# Patient Record
Sex: Female | Born: 1975 | Hispanic: Yes | Marital: Single | Smoking: Never smoker
Health system: Southern US, Community
[De-identification: ages and names within clinical notes are randomized; demographics above are authoritative.]

---

## 2019-07-30 ENCOUNTER — Emergency Department (HOSPITAL_BASED_OUTPATIENT_CLINIC_OR_DEPARTMENT_OTHER)
Admission: EM | Admit: 2019-07-30 | Discharge: 2019-07-30 | Disposition: A | Discharge status: Home / Self Care | Payer: 59 | Attending: Emergency Medicine | Admitting: Emergency Medicine

## 2019-07-30 ENCOUNTER — Other Ambulatory Visit: Payer: Self-pay

## 2019-07-30 ENCOUNTER — Encounter (HOSPITAL_BASED_OUTPATIENT_CLINIC_OR_DEPARTMENT_OTHER): Payer: Self-pay | Admitting: Emergency Medicine

## 2019-07-30 DIAGNOSIS — U071 COVID-19: Secondary | ICD-10-CM | POA: Diagnosis not present

## 2019-07-30 DIAGNOSIS — R519 Headache, unspecified: Secondary | ICD-10-CM | POA: Diagnosis present

## 2019-07-30 LAB — SARS CORONAVIRUS 2 AG (30 MIN TAT): SARS Coronavirus 2 Ag: POSITIVE — AB

## 2019-07-30 MED ORDER — DIPHENHYDRAMINE HCL 50 MG/ML IJ SOLN: 12.5000 mg | Freq: Once | INTRAMUSCULAR | Status: DC

## 2019-07-30 MED ORDER — BENZONATATE 100 MG PO CAPS: 100.0000 mg | ORAL_CAPSULE | Freq: Three times a day (TID) | ORAL | 0 refills | Status: DC

## 2019-07-30 MED ORDER — KETOROLAC TROMETHAMINE 15 MG/ML IJ SOLN
15.0000 mg | Freq: Once | INTRAMUSCULAR | Status: AC
  Administered 2019-07-30: 15 mg via INTRAMUSCULAR
  Filled 2019-07-30: qty 1

## 2019-07-30 MED ORDER — ONDANSETRON 4 MG PO TBDP: 4.0000 mg | ORAL_TABLET | Freq: Three times a day (TID) | ORAL | 0 refills | Status: DC | PRN

## 2019-07-30 MED ORDER — KETOROLAC TROMETHAMINE 15 MG/ML IJ SOLN: 15.0000 mg | Freq: Once | INTRAMUSCULAR | Status: DC

## 2019-07-30 MED ORDER — DIPHENHYDRAMINE HCL 50 MG/ML IJ SOLN
12.5000 mg | Freq: Once | INTRAMUSCULAR | Status: AC
  Administered 2019-07-30: 12.5 mg via INTRAMUSCULAR
  Filled 2019-07-30: qty 1

## 2019-07-30 MED ORDER — PROCHLORPERAZINE EDISYLATE 10 MG/2ML IJ SOLN: 10.0000 mg | Freq: Once | INTRAMUSCULAR | Status: DC

## 2019-07-30 MED ORDER — PROCHLORPERAZINE EDISYLATE 10 MG/2ML IJ SOLN
10.0000 mg | Freq: Once | INTRAMUSCULAR | Status: AC
  Administered 2019-07-30: 10 mg via INTRAMUSCULAR
  Filled 2019-07-30: qty 2

## 2019-07-30 NOTE — ED Triage Notes (Signed)
Pt c/o HA x 4 days, loss of taste and smell x 2 days. Pt denies fever at this time. Pt also reports legs "feel weak". Norma gait, NAD. Pt states she is here for covid test for symptoms, denies any known exposure

## 2019-07-30 NOTE — ED Provider Notes (Signed)
[facility] EMERGENCY DEPARTMENT Provider Note   CSN: [id number] Arrival date & time: 07/30/19  1256     History Chief Complaint  Patient presents with  . Headache    Jacqueline Stone is a 43 y.o. female with no significant past medical history who presents to the ED due to intermittent and worsening bilateral frontal headache for the past week.  Patient notes she typically has similar headaches right before her menstrual period, but states this headache has become more consistent.  She describes her headache as a tightness feeling and rates it a 5/10.  Her last menstrual period was November 13.  Patient notes her headache went away for a few days but has become more constant over the past 4 days.  Patient also notes loss of taste and smell for the past 2 days. Patient states 7 coworkers have tested positive for Covid at her work.  Patient admits to dry cough, congestion, and intermittent nausea.  Patient has not tried anything at home for her symptoms.  Patient denies shortness of breath, fever, chest pain, vomiting, and diarrhea.   History reviewed. No pertinent past medical history.  There are no problems to display for this patient.   History reviewed. No pertinent surgical history.   OB History   No obstetric history on file.     History reviewed. No pertinent family history.  Social History   Tobacco Use  . Smoking status: Never Smoker  Substance Use Topics  . Alcohol use: Never  . Drug use: Never    Home Medications Prior to Admission medications   Medication Sig Start Date End Date Taking? Authorizing Provider  benzonatate (TESSALON) 100 MG capsule Take 1 capsule (100 mg total) by mouth every 8 (eight) hours. 07/30/19   Cheek, Chrys Racer B, PA-C  ondansetron (ZOFRAN ODT) 4 MG disintegrating tablet Take 1 tablet (4 mg total) by mouth every 8 (eight) hours as needed for nausea or vomiting. 07/30/19   Cheek, Comer Locket, PA-C    Allergies    Patient has no  allergy information on record.  Review of Systems   Review of Systems  Constitutional: Positive for fatigue. Negative for chills and fever.  HENT: Positive for congestion. Negative for sinus pressure, sinus pain, sore throat, tinnitus and trouble swallowing.   Respiratory: Positive for cough. Negative for shortness of breath.   Cardiovascular: Negative for chest pain and leg swelling.  Gastrointestinal: Positive for abdominal pain (generalized) and nausea. Negative for diarrhea and vomiting.  Genitourinary: Negative for dysuria and vaginal discharge.  Neurological: Positive for weakness (generalized) and headaches. Negative for dizziness, syncope and numbness.  All other systems reviewed and are negative.   Physical Exam Updated Vital Signs BP 116/73 (BP Location: Right Arm)   Pulse 74   Temp 98.7 F (37.1 C) (Oral)   Resp 18   Ht 5' (1.524 m)   Wt 72.6 kg   LMP 06/30/2019 (Approximate)   SpO2 100%   BMI 31.25 kg/m   Physical Exam Vitals and nursing note reviewed.  Constitutional:      General: She is not in acute distress.    Appearance: She is not ill-appearing.  HENT:     Head: Normocephalic.     Mouth/Throat:     Comments: Posterior oropharynx clear and mucous membranes moist, there is mild erythema but no edema or tonsillar exudates, uvula midline, normal phonation, no trismus, tolerating secretions without difficulty. Eyes:     Conjunctiva/sclera: Conjunctivae normal.     Pupils:  Pupils are equal, round, and reactive to light.  Neck:     Comments: No meningismus  Cardiovascular:     Rate and Rhythm: Normal rate and regular rhythm.     Pulses: Normal pulses.     Heart sounds: Normal heart sounds. No murmur. No friction rub. No gallop.   Pulmonary:     Effort: Pulmonary effort is normal.     Breath sounds: Normal breath sounds.     Comments: Respirations equal and unlabored, patient able to speak in full sentences, lungs clear to auscultation bilaterally  Abdominal:     General: Abdomen is flat. Bowel sounds are normal. There is no distension.     Palpations: Abdomen is soft.     Tenderness: There is no abdominal tenderness. There is no guarding or rebound.  Musculoskeletal:     Cervical back: Normal range of motion and neck supple. No rigidity or tenderness.     Comments: Able to move all 4 extremities without difficulty. No lower extremity edema. Distal sensation and pulses intact bilaterally.  Skin:    General: Skin is warm and dry.  Neurological:     General: No focal deficit present.     Mental Status: She is alert.     Comments: Speech is clear, able to follow commands CN III-XII intact Normal strength in upper and lower extremities bilaterally including dorsiflexion and plantar flexion, strong and equal grip strength Sensation grossly intact throughout Moves extremities without ataxia, coordination intact No pronator drift Ambulates without difficulty      ED Results / Procedures / Treatments   Labs (all labs ordered are listed, but only abnormal results are displayed) Labs Reviewed  SARS CORONAVIRUS 2 AG (30 MIN TAT) - Abnormal; Notable for the following components:      Result Value   SARS Coronavirus 2 Ag POSITIVE (*)    All other components within normal limits    EKG None  Radiology No results found.  Procedures Procedures (including critical care time)  Medications Ordered in ED Medications  diphenhydrAMINE (BENADRYL) injection 12.5 mg (12.5 mg Intramuscular Given 07/30/19 1439)  ketorolac (TORADOL) 15 MG/ML injection 15 mg (15 mg Intramuscular Given 07/30/19 1439)  prochlorperazine (COMPAZINE) injection 10 mg (10 mg Intramuscular Given 07/30/19 1440)    ED Course  I have reviewed the triage vital signs and the nursing notes.  Pertinent labs & imaging results that were available during my care of the patient were reviewed by me and considered in my medical decision making (see chart for details).     MDM Rules/Calculators/A&P     CHA2DS2/VAS Stroke Risk Points      N/A >= 2 Points: High Risk  1 - 1.99 Points: Medium Risk  0 Points: Low Risk    A final score could not be computed because of missing components.: Last  Change: N/A     This score determines the patient's risk of having a stroke if the  patient has atrial fibrillation.      This score is not applicable to this patient. Components are not  calculated.                  43 year old female presents to the ED due to bilateral frontal headache and COVID-like symptoms. Patient had 7 coworkers test positive for COVID. Vitals all within normal limits. Patient in no acute distress and rather well appearing. Abdomen soft, non-distended, and non-tender. No peritoneal signs. Lungs clear to auscultation bilaterally. Normal neurological exam. Suspect  tension headache with possible relationship to COVID-19 infection. Doubt emergent conditions such as SAH given normal neurological exam and longevity of symptoms. No concern for meningitis at this time. Will test for COVID and treat pain with headache cocktail and reassess.  Reassessed patient at bedside after headache medication. Patient notes her headache has improved. Rapid COVID test is positive. Discussed with patient in length about quarantine guidelines. Will treat patient with cough medication and Zofran for nausea. Patient advised to return to the ER if she develops severe shortness of breath, chest pain, or worsening symptoms. Patient advised to continue to drink a lot of fluids. Patient able to ambulate in the ED and maintain O2 saturation >95% and didn't complain of shortness of breath or dizziness. Strict ED precautions discussed with patient. Patient states understanding and agrees to plan. Patient discharged home in no acute distress and stable vitals  Jacqueline Stone was evaluated in Emergency Department on 07/30/2019 for the symptoms described in the history of present illness. She was  evaluated in the context of the global COVID-19 pandemic, which necessitated consideration that the patient might be at risk for infection with the SARS-CoV-2 virus that causes COVID-19. Institutional protocols and algorithms that pertain to the evaluation of patients at risk for COVID-19 are in a state of rapid change based on information released by regulatory bodies including the CDC and federal and state organizations. These policies and algorithms were followed during the patient's care in the ED.  Final Clinical Impression(s) / ED Diagnoses Final diagnoses:  COVID-19 virus infection    Rx / DC Orders ED Discharge Orders         Ordered    benzonatate (TESSALON) 100 MG capsule  Every 8 hours     07/30/19 1527    ondansetron (ZOFRAN ODT) 4 MG disintegrating tablet  Every 8 hours PRN     07/30/19 1527           Lorelle Formosa 07/30/19 1608    Cathren Laine, MD 07/31/19 1358

## 2019-07-30 NOTE — Discharge Instructions (Addendum)
As discussed, you tested positive for COVID-19 which is a viral infection that does not require antibiotics. I am sending you home with cough medication and nausea medication. Take as prescribed and use as needed. You must quarantine at home 10 days after first symptom and must be fever free for 24 hours before returning to work. You may want to contact you work to see what their policy is. Continue drinking a lot of fluids. Return to the ER if you develop severe shortness of breath, central chest pain, or worsening of symptoms.

## 2021-08-20 NOTE — Progress Notes (Signed)
Subjective:    CC: L foot pain  I, Molly Weber, LAT, ATC, am serving as scribe for Dr. Clementeen Graham.  HPI: Pt is a 46 y/o female presenting w/ L foot pain x 1 year and 4 month w/ no MOI.  She locates her pain to medial longitudinal arch. 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  Pain is located primarily at the medial to plantar aspect of the navicular area.  She notes that typically the stilted boots are worse than regular shoes.  She is tried some custom-made orthotics which did not help very much.  L foot swelling: yes- w/ increased activity, medial ankle/foot Aggravating factors: wearing the steel-toed shoes on for work Treatments tried: prior dr visits, ice, shoe inserts, compression  Pertinent review of Systems: No fevers or chills  Relevant historical information: Otherwise healthy   Objective:    Vitals:   08/21/21 0955  BP: 102/70  Pulse: 82  SpO2: 98%   General: Well Developed, well nourished, and in no acute distress.   MSK: Left foot mild pes planus otherwise normal-appearing Mildly tender palpation medial and plantar midfoot near the navicular area. Normal foot and ankle motion. Intact strength. Pulses capillary refill and sensation are intact distally.  Lab and Radiology Results  Procedure: Real-time Ultrasound Guided Injection of left foot talocalcaneonavicular joint medial approach Device: Philips Affiniti 50G Images permanently stored and available for review in PACS Ultrasound evaluation prior to injection The talocalcaneonavicular joint aspect has small joint effusion. Posterior tibialis tendon intact appearing Deep plantar midfoot normal-appearing although ultrasound accuracy limited in this area due to soft tissue attenuation. Verbal informed consent obtained.  Discussed risks and benefits of procedure. Warned about infection bleeding damage to structures skin hypopigmentation and fat atrophy among others. Patient expresses  understanding and agreement Time-out conducted.   Noted no overlying erythema, induration, or other signs of local infection.   Skin prepped in a sterile fashion.   Local anesthesia: Topical Ethyl chloride.   With sterile technique and under real time ultrasound guidance: 40 mg of Kenalog and 2 mL of Marcaine injected into The talocalcaneonavicular joint . Fluid seen entering the joint capsule.   Completed without difficulty   Pain immediately resolved suggesting accurate placement of the medication.   Advised to call if fevers/chills, erythema, induration, drainage, or persistent bleeding.   Images permanently stored and available for review in the ultrasound unit.  Impression: Technically successful ultrasound guided injection.   X-ray left ankle and foot obtained today personally and independently interpreted  Left ankle: No acute fractures. No severe DJD  Left foot: No acute fracture. No severe DJD.  Await formal radiology review     Impression and Recommendations:    Assessment and Plan: 46 y.o. female with left midfoot pain.  Pain predominantly located in the navicular area.  She does have a small joint effusion at the talocalcanealnavicular joint..  Plan for injection today, arch support with scaphoid pads, and HEP working on post tib tendon and recheck in 6 weeks.  PDMP not reviewed this encounter. Orders Placed This Encounter  Procedures   [place] LIMITED JOINT SPACE STRUCTURES LOW LEFT(NO LINKED CHARGES)    Standing Status:   Future    Number of Occurrences:   1    Standing Expiration Date:   02/18/2022    Order Specific Question:   Reason for Exam (SYMPTOM  OR DIAGNOSIS REQUIRED)    Answer:   left foot pain  Order Specific Question:   Preferred imaging location?    Answer:   [facility] Foot Complete Left    Standing Status:   Future    Number of Occurrences:   1    Standing Expiration Date:   08/21/2022    Order Specific Question:    Reason for Exam (SYMPTOM  OR DIAGNOSIS REQUIRED)    Answer:   left foot pain    Order Specific Question:   Preferred imaging location?    Answer:   [facility]    Order Specific Question:   Is patient pregnant?    Answer:   No   DG Ankle Complete Left    Standing Status:   Future    Number of Occurrences:   1    Standing Expiration Date:   08/21/2022    Order Specific Question:   Reason for Exam (SYMPTOM  OR DIAGNOSIS REQUIRED)    Answer:   left foot pain    Order Specific Question:   Preferred imaging location?    Answer:   [facility]    Order Specific Question:   Is patient pregnant?    Answer:   No   No orders of the defined types were placed in this encounter.   Discussed warning signs or symptoms. Please see discharge instructions. Patient expresses understanding.   The above documentation has been reviewed and is accurate and complete Clementeen Graham, M.D.

## 2021-08-21 ENCOUNTER — Ambulatory Visit: Payer: Self-pay

## 2021-08-21 ENCOUNTER — Ambulatory Visit (INDEPENDENT_AMBULATORY_CARE_PROVIDER_SITE_OTHER): Payer: 59 | Admitting: Family Medicine

## 2021-08-21 ENCOUNTER — Other Ambulatory Visit: Payer: Self-pay

## 2021-08-21 ENCOUNTER — Ambulatory Visit (INDEPENDENT_AMBULATORY_CARE_PROVIDER_SITE_OTHER): Payer: 59

## 2021-08-21 VITALS — BP 102/70 | HR 82 | Ht 60.0 in | Wt 155.2 lb

## 2021-08-21 DIAGNOSIS — M79672 Pain in left foot: Secondary | ICD-10-CM

## 2021-08-21 DIAGNOSIS — G8929 Other chronic pain: Secondary | ICD-10-CM | POA: Diagnosis not present

## 2021-08-21 NOTE — Patient Instructions (Addendum)
Thank you for coming in today.   Please get an Xray today before you leave  You received a steroid injection today. Seek immediate medical attention if the joint becomes red, extremely painful, or is oozing fluid.   Please complete the exercises that the athletic trainer went over with you:  View at [email or web address] using code: [email or web address]

## 2021-08-22 NOTE — Progress Notes (Signed)
Left foot and ankle x-ray shows no fracture.  No severe arthritis.

## 2021-08-22 NOTE — Progress Notes (Signed)
Left ankle x-ray shows no fracture of the foot and ankle.

## 2021-10-01 NOTE — Progress Notes (Signed)
° °  I, Christoper Fabian, LAT, ATC, am serving as scribe for Dr. Clementeen Graham.  Jacqueline Stone is a 46 y.o. female who presents to [facility] today for f/u of L medial foot/talocalcaneonavicular pain.  She was last seen by Dr. Denyse Amass on 08/21/21 and had a L talocalcaneonavicular steroid injection.  She was also shown a HEP.  Today, pt reports that she is feeling much better and notes that the injection helped w/ her pain.  She states that she con't to have some pain in her L heel and at her L lateral ankle but notes that this is intermittent in nature.  She rates her improvement at 95-100%.    Notes a little bit of plantar heel pain with prolonged standing.  Diagnostic testing: L foot and ankle XR- 08/21/21  Pertinent review of systems: No fevers or chills  Relevant historical information: Otherwise healthy   Exam:  BP 96/64 (BP Location: Right Arm, Patient Position: Sitting, Cuff Size: Normal)    Pulse 88    Ht 5' (1.524 m)    Wt 155 lb 12.8 oz (70.7 kg)    SpO2 95%    BMI 30.43 kg/m  General: Well Developed, well nourished, and in no acute distress.   MSK: Left foot and ankle normal-appearing. Trace tenderness at plantar calcaneus. Normal foot and ankle motion.  Intact strength.    Lab and Radiology Results EXAM: LEFT FOOT - COMPLETE 3+ VIEW; LEFT ANKLE COMPLETE - 3+ VIEW   COMPARISON:  None.   FINDINGS: No evidence of fracture, dislocation, or joint effusion. No evidence of severe arthropathy. No aggressive appearing focal bone abnormality. Soft tissues are unremarkable. No retained radiopaque foreign body.   IMPRESSION: No acute displaced fracture or dislocation of the left foot and ankle.     Electronically Signed   By: Tish Frederickson M.D.   On: 08/21/2021 15:38   I, Clementeen Graham, personally (independently) visualized and performed the interpretation of the images attached in this note.      Assessment and Plan: 46 y.o. female with may have  again improvement in foot and ankle pain following injection, scaphoid pads, exercises.  Plan to continue home exercise program. She has a little bit of remaining pain at the plantar calcaneus which is likely to be Planter fasciitis.  Her symptoms are so minimal today that it is hard to recommend much treatment beyond heel cushioning. I showed her a couple of good examples of insoles that incorporate both heel cushions and good arch support that I think would be a good option for her.  If not improving certainly there is way more to do for Planter fasciitis and for her medial foot and ankle pain.    Discussed warning signs or symptoms. Please see discharge instructions. Patient expresses understanding.   The above documentation has been reviewed and is accurate and complete Clementeen Graham, M.D.   Total encounter time 20 minutes including face-to-face time with the patient and, reviewing past medical record, and charting on the date of service.   Treatment plan and options.

## 2021-10-02 ENCOUNTER — Other Ambulatory Visit: Payer: Self-pay

## 2021-10-02 ENCOUNTER — Ambulatory Visit (INDEPENDENT_AMBULATORY_CARE_PROVIDER_SITE_OTHER): Payer: 59 | Admitting: Family Medicine

## 2021-10-02 VITALS — BP 96/64 | HR 88 | Ht 60.0 in | Wt 155.8 lb

## 2021-10-02 DIAGNOSIS — M79672 Pain in left foot: Secondary | ICD-10-CM

## 2021-10-02 DIAGNOSIS — M722 Plantar fascial fibromatosis: Secondary | ICD-10-CM

## 2021-10-02 DIAGNOSIS — G8929 Other chronic pain: Secondary | ICD-10-CM | POA: Diagnosis not present

## 2021-10-02 NOTE — Patient Instructions (Addendum)
Good to see you today.  Con't w/ your home exercises.  Follow-up as needed.  Scaphoid pads for arch support from [place].   Heel cushion for plantar fasciitis.

## 2022-12-14 IMAGING — DX DG ANKLE COMPLETE 3+V*L*
3 series · 3 of 3 positions shown · non-contrast
Comparison: None.

CLINICAL DATA: left foot pain. General left foot/ankle pain x 1
year, noticed when wearing steel-toe boots for her work.

EXAM:
LEFT FOOT - COMPLETE 3+ VIEW; LEFT ANKLE COMPLETE - 3+ VIEW

[ankle ap]
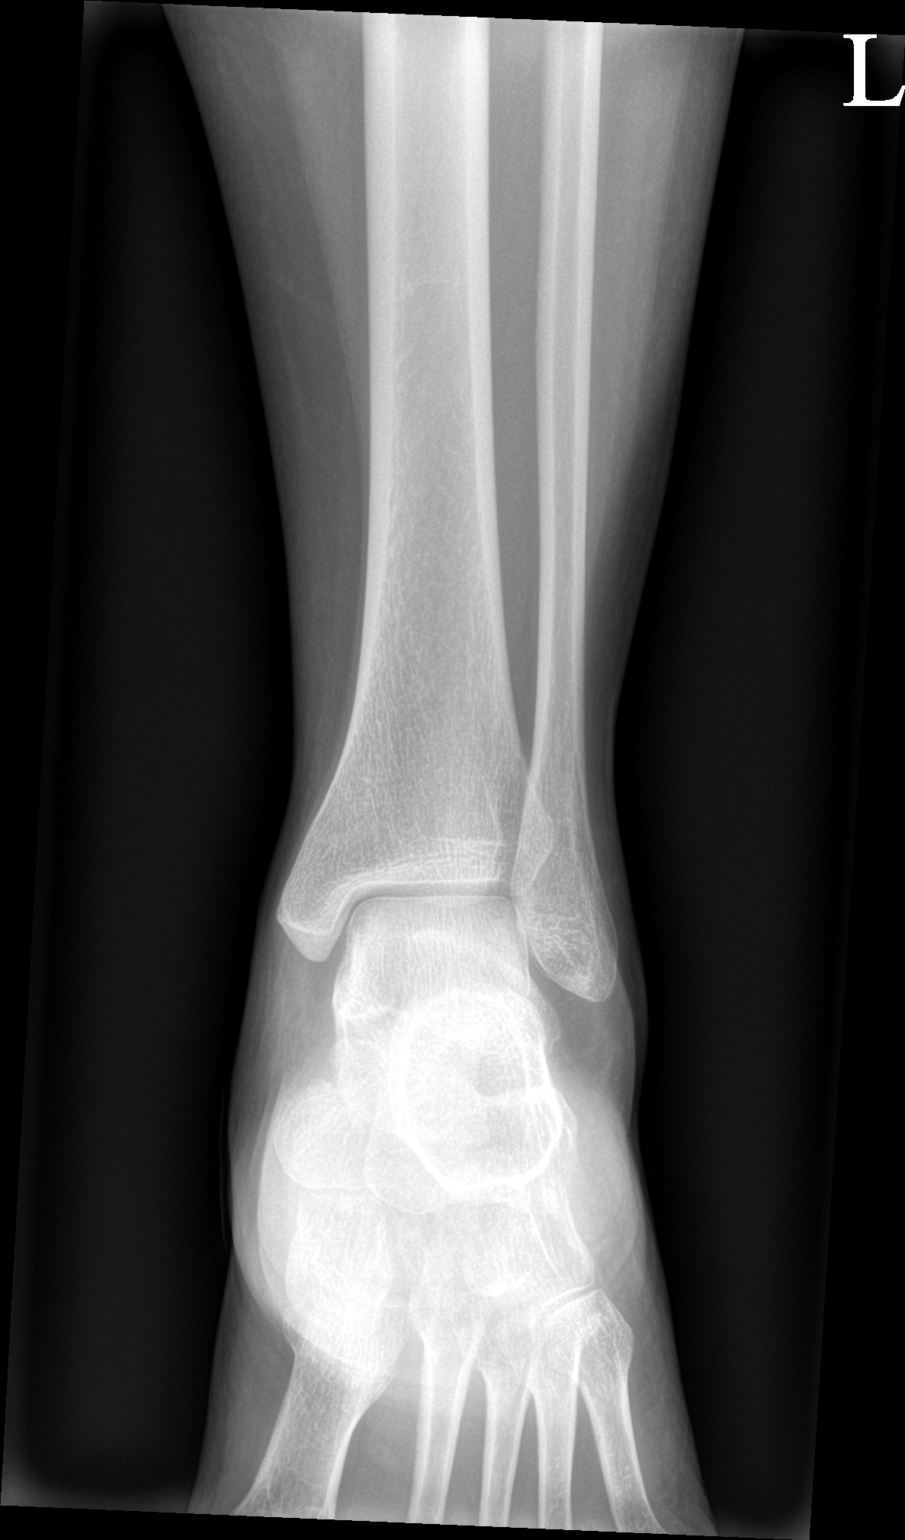

[ankle obl]
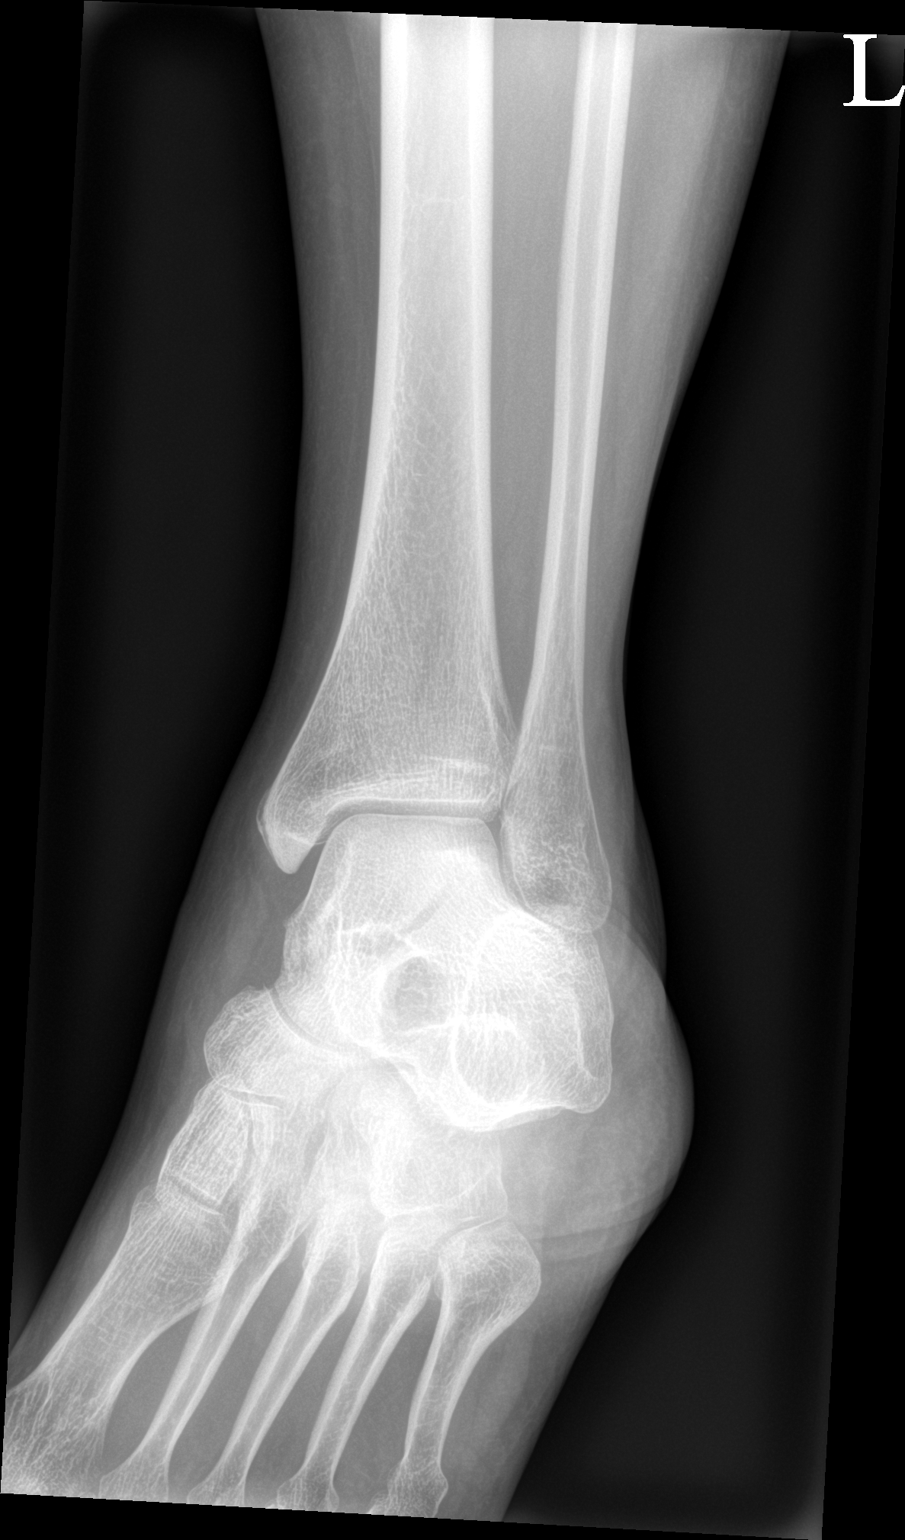

[ankle lat]
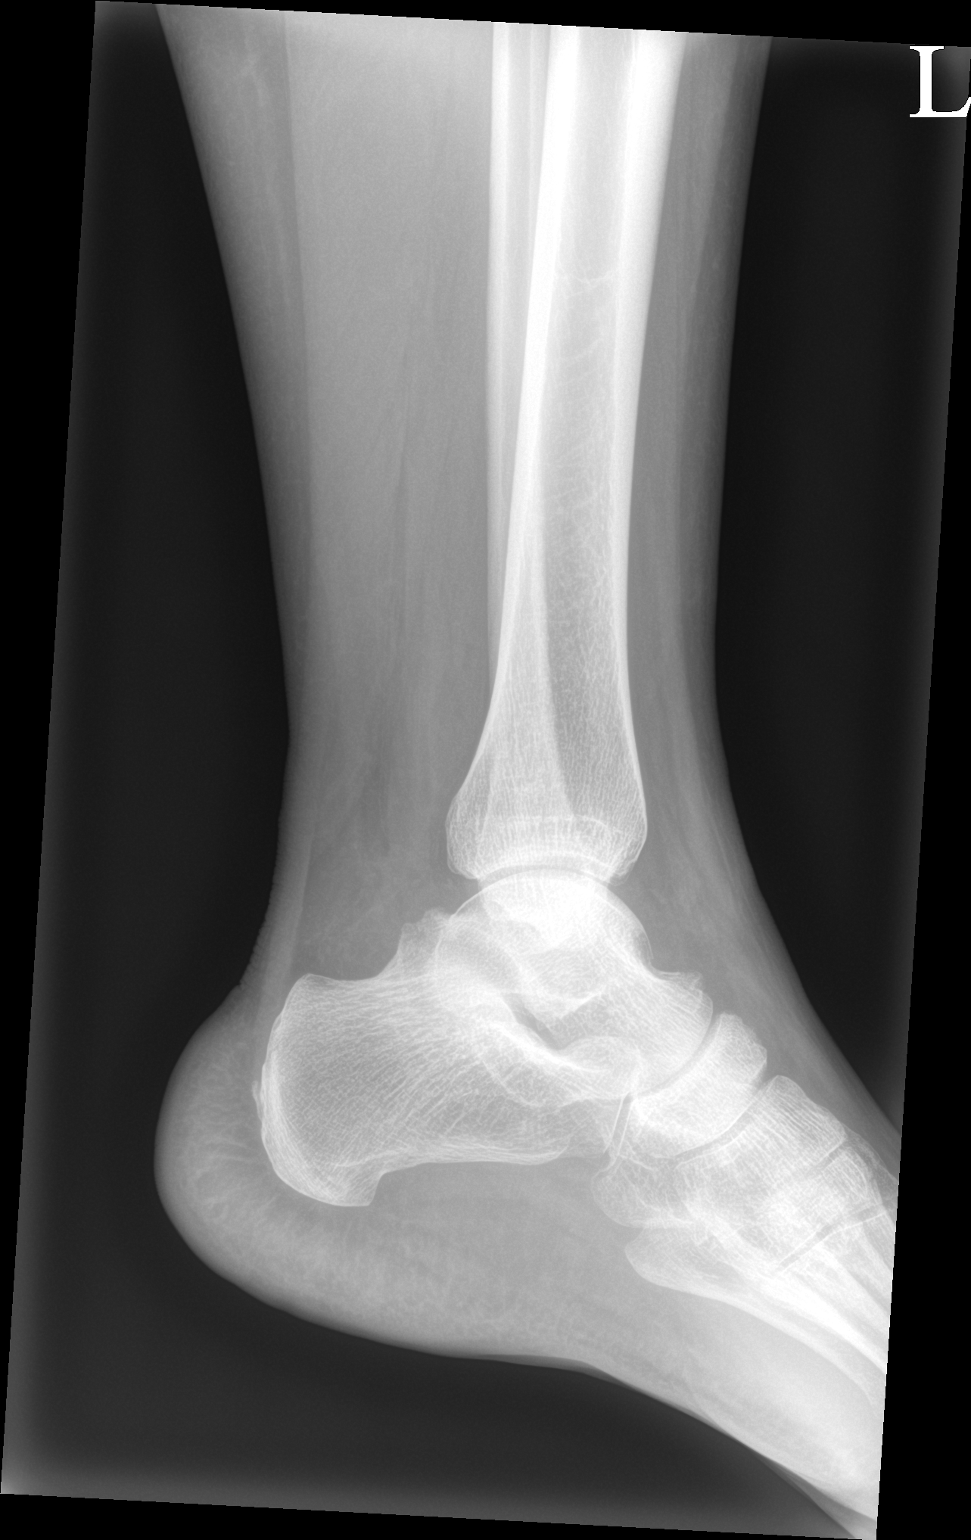

[3 of 3 positions shown; findings below may reference images not displayed]

FINDINGS: No evidence of fracture, dislocation, or joint effusion. No evidence
of severe arthropathy. No aggressive appearing focal bone
abnormality. Soft tissues are unremarkable. No retained radiopaque
foreign body.
IMPRESSION: No acute displaced fracture or dislocation of the left foot and
ankle.

## 2024-08-14 ENCOUNTER — Other Ambulatory Visit: Payer: Self-pay

## 2024-08-14 ENCOUNTER — Emergency Department (HOSPITAL_BASED_OUTPATIENT_CLINIC_OR_DEPARTMENT_OTHER)
Admission: EM | Admit: 2024-08-14 | Discharge: 2024-08-14 | Disposition: A | Discharge status: Home / Self Care | Attending: Emergency Medicine | Admitting: Emergency Medicine

## 2024-08-14 ENCOUNTER — Encounter (HOSPITAL_BASED_OUTPATIENT_CLINIC_OR_DEPARTMENT_OTHER): Payer: Self-pay

## 2024-08-14 DIAGNOSIS — M5442 Lumbago with sciatica, left side: Secondary | ICD-10-CM | POA: Insufficient documentation

## 2024-08-14 DIAGNOSIS — M545 Low back pain, unspecified: Secondary | ICD-10-CM | POA: Diagnosis present

## 2024-08-14 DIAGNOSIS — K5900 Constipation, unspecified: Secondary | ICD-10-CM | POA: Insufficient documentation

## 2024-08-14 MED ORDER — METHOCARBAMOL 500 MG PO TABS: 500.0000 mg | ORAL_TABLET | Freq: Two times a day (BID) | ORAL | 0 refills | Status: AC

## 2024-08-14 MED ORDER — NAPROXEN 500 MG PO TABS: 500.0000 mg | ORAL_TABLET | Freq: Two times a day (BID) | ORAL | 0 refills | Status: AC

## 2024-08-14 MED ORDER — POLYETHYLENE GLYCOL 3350 17 G PO PACK: 17.0000 g | PACK | Freq: Every day | ORAL | 0 refills | Status: AC

## 2024-08-14 NOTE — ED Triage Notes (Signed)
 Lower back pain radiates to L hip for 6 days. Denies known injury or urinary symptoms.   Also reports constipation, last BM 5 days ago. Denies abd pain, NV, fevers

## 2024-08-14 NOTE — Discharge Instructions (Addendum)
 You are seen today for acute left-sided low back pain with left-sided sciatica with additionally noting to have had difficult bowel movements recently.  Recommending that you continue to use naproxen  and Robaxin  for back pain.  Additionally you can use Tylenol for additional relief.  Furthermore I am sending in some medication for the difficult bowel movements you have endorsed, this will help with stools, please be sure to hydrate when taking this medication.  Please return to follow-up with your PCP for any persistent symptoms.  Return to the ER for any new or worsening symptoms which include numbness or tingling around genitals, fecal/urinary incontinence, fever, lower extremity weakness

## 2024-08-14 NOTE — ED Provider Notes (Signed)
 " [facility] Provider Note   CSN: [id number] Arrival date & time: 08/14/24  1529     Patient presents with: Back Pain and Constipation   Jacqueline Stone is a 48 y.o. female.   Back Pain Constipation Associated symptoms: back pain    Patient is a 48 year old female presenting ED today for concerns for left-sided low back pain rating down the left leg that has been present x 6 days, happening after she bent over while at work to lift up a box.  Has not been taking medications and has noted just persistent symptoms.  Additionally she also has had some constipation, with last bowel movement yesterday noting to be hard and pebbly.  Denies red flag symptoms of fever, dysuria, hematuria, saddle paresthesias, fecal/urinary incontinence, lower extremity weakness/tingling, trauma, IVD use.  Additionally denying headache, vision changes, chest pain, shortness of breath, abdominal pain, nausea, vomiting, diarrhea, melena, hematochezia, lower leg swelling.    Prior to Admission medications  Medication Sig Start Date End Date Taking? Authorizing Provider  diclofenac (VOLTAREN) 50 MG EC tablet Take 50 mg by mouth 3 (three) times daily as needed. 08/11/24  Yes [provider]  polyethylene glycol (MIRALAX) 17 g packet Take 17 g by mouth daily. 08/14/24  Yes Beola Terrall RAMAN, PA-C  methocarbamol  (ROBAXIN ) 500 MG tablet Take 1 tablet (500 mg total) by mouth 2 (two) times daily. 08/14/24  Yes Beola Terrall RAMAN, PA-C  naproxen  (NAPROSYN ) 500 MG tablet Take 1 tablet (500 mg total) by mouth 2 (two) times daily. 08/14/24  Yes Mussa Groesbeck S, PA-C    Allergies: Patient has no known allergies.    Review of Systems  Gastrointestinal:  Positive for constipation.  Musculoskeletal:  Positive for back pain.  All other systems reviewed and are negative.   Updated Vital Signs BP 109/75 (BP Location: Right Arm)   Pulse 96   Temp 98.4 F (36.9 C)   Resp  16   LMP 07/28/2024   SpO2 100%   Physical Exam Vitals and nursing note reviewed.  Constitutional:      General: She is not in acute distress.    Appearance: Normal appearance. She is not ill-appearing or diaphoretic.  HENT:     Head: Normocephalic and atraumatic.  Eyes:     General: No scleral icterus.       Right eye: No discharge.        Left eye: No discharge.     Extraocular Movements: Extraocular movements intact.     Conjunctiva/sclera: Conjunctivae normal.  Cardiovascular:     Rate and Rhythm: Normal rate and regular rhythm.     Pulses: Normal pulses.     Heart sounds: Normal heart sounds. No murmur heard.    No friction rub. No gallop.  Pulmonary:     Effort: Pulmonary effort is normal. No respiratory distress.     Breath sounds: No stridor. No wheezing, rhonchi or rales.  Chest:     Chest wall: No tenderness.  Abdominal:     General: Abdomen is flat. There is no distension.     Palpations: Abdomen is soft.     Tenderness: There is no abdominal tenderness. There is no right CVA tenderness, left CVA tenderness, guarding or rebound.  Musculoskeletal:        General: Tenderness (Notably has tenderness to left lower back or paraspinal muscles, without midline tenderness, no crepitus, step-off noted.) present. No swelling, deformity or signs of injury.  Cervical back: Normal range of motion. No rigidity.     Right lower leg: No edema.     Left lower leg: No edema.     Comments: Positive straight leg test on left side.  Neurovascularly intact bilaterally to lower extremities, DP pulse 2+.  Skin:    General: Skin is warm and dry.     Findings: No bruising, erythema or lesion.  Neurological:     General: No focal deficit present.     Mental Status: She is alert and oriented to person, place, and time. Mental status is at baseline.     Sensory: No sensory deficit.     Motor: No weakness.  Psychiatric:        Mood and Affect: Mood normal.     (all labs ordered are  listed, but only abnormal results are displayed) Labs Reviewed - No data to display   EKG: None  Radiology: No results found.  Procedures   Medications Ordered in the ED - No data to display    Medical Decision Making Amount and/or Complexity of Data Reviewed Labs: ordered.   This patient is a 48 year old female who presents to the ED for concern of left-sided low back pain that happened approximately 6 days ago after she was bending over at work to pick up a  box.  Endorsing some harder stools, last bowel movement yesterday.  On physical exam, patient is in no acute distress, afebrile, alert and orient x 4, speaking in full sentences, nontachypneic, nontachycardic.  LCTAB, RRR, no murmur, no abdominal tenderness to palpation, no CVA tenderness.  Does notably does have some left lower back tenderness to palpation over the paraspinal muscles, without any midline tenderness.  Does have positive straight leg test to left side.  With exam otherwise unremarkable, neurovascularly intact.  With patient's overall well-appearing presentation, will symptoms of anti-inflammatories and muscle lectures for her, having her follow-up with her PCP for any persistent pain.  Providing she return ER precautions, low suspicion for any emergent pathology present at this time.  Low suspicion for cauda equina, pyelonephritis.  Patient vital signs have remained stable throughout the course of patient's time in the ED. Low suspicion for any other emergent pathology at this time. I believe this patient is safe to be discharged. Provided strict return to ER precautions. Patient expressed agreement and understanding of plan. All questions were answered.  Differential diagnoses prior to evaluation: The emergent differential diagnosis includes, but is not limited to,  Fracture (acute/chronic), muscle strain, cauda equina, spinal stenosis, DDD,  kidney stone, pyelonephritis, SBO, constipation.  . This is not an  exhaustive differential.   Past Medical History / Co-morbidities / Social History: No reported chronic past medical history.  Additional history: Chart reviewed. Pertinent results include:  Last seen for mammogram on 01/06/2024.   Medications: I ordered medication including naproxen , Robaxin , MiraLAX.  I have reviewed the patients home medicines and have made adjustments as needed.  Critical Interventions: None  Social Determinants of Health: Has good follow-up with PCP  Disposition: After consideration of the diagnostic results and the patients response to treatment, I feel that the patient would benefit from discharge and treatment as above.   emergency department workup does not suggest an emergent condition requiring admission or immediate intervention beyond what has been performed at this time. The plan is: Depending management home, return to the ER for new or worsening symptoms, follow-up with PCP. The patient is safe for discharge and has been instructed to  return immediately for worsening symptoms, change in symptoms or any other concerns.  Final diagnoses:  Acute left-sided low back pain with left-sided sciatica  Difficult bowel movements    ED Discharge Orders          Ordered    naproxen  (NAPROSYN ) 500 MG tablet  2 times daily        08/14/24 2003    methocarbamol  (ROBAXIN ) 500 MG tablet  2 times daily        08/14/24 2003    polyethylene glycol (MIRALAX) 17 g packet  Daily        08/14/24 2004               Beola Derry [place], PA-C 08/14/24 2011  "
